# Patient Record
Sex: Male | Born: 1990 | Race: Black or African American | Hispanic: No | Marital: Single | Smoking: Never smoker
Health system: Southern US, Community
[De-identification: ages and names within clinical notes are randomized; demographics above are authoritative.]

---
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
## 2010-06-17 ENCOUNTER — Encounter: Payer: Self-pay | Admitting: Family Medicine

## 2015-06-06 ENCOUNTER — Encounter (HOSPITAL_COMMUNITY): Payer: Self-pay | Admitting: Family Medicine

## 2015-06-06 ENCOUNTER — Inpatient Hospital Stay (HOSPITAL_COMMUNITY)
Admission: EM | Admit: 2015-06-06 | Discharge: 2015-06-09 | DRG: 358 | Disposition: A | Discharge status: Home / Self Care | Payer: 59 | Attending: Surgery | Admitting: Surgery

## 2015-06-06 ENCOUNTER — Emergency Department (HOSPITAL_COMMUNITY): Payer: 59

## 2015-06-06 DIAGNOSIS — R1031 Right lower quadrant pain: Secondary | ICD-10-CM | POA: Diagnosis present

## 2015-06-06 DIAGNOSIS — R1903 Right lower quadrant abdominal swelling, mass and lump: Principal | ICD-10-CM | POA: Diagnosis present

## 2015-06-06 DIAGNOSIS — Z91010 Allergy to peanuts: Secondary | ICD-10-CM

## 2015-06-06 DIAGNOSIS — R188 Other ascites: Secondary | ICD-10-CM | POA: Diagnosis present

## 2015-06-06 DIAGNOSIS — K5900 Constipation, unspecified: Secondary | ICD-10-CM | POA: Diagnosis present

## 2015-06-06 LAB — CBC
HCT: 45.6 % (ref 39.0–52.0)
HEMOGLOBIN: 14.7 g/dL (ref 13.0–17.0)
MCH: 28.2 pg (ref 26.0–34.0)
MCHC: 32.2 g/dL (ref 30.0–36.0)
MCV: 87.5 fL (ref 78.0–100.0)
PLATELETS: 346 10*3/uL (ref 150–400)
RBC: 5.21 MIL/uL (ref 4.22–5.81)
RDW: 12.9 % (ref 11.5–15.5)
WBC: 9.2 10*3/uL (ref 4.0–10.5)

## 2015-06-06 LAB — URINALYSIS, ROUTINE W REFLEX MICROSCOPIC
BILIRUBIN URINE: NEGATIVE
GLUCOSE, UA: NEGATIVE mg/dL
HGB URINE DIPSTICK: NEGATIVE
KETONES UR: NEGATIVE mg/dL
Nitrite: NEGATIVE
PROTEIN: NEGATIVE mg/dL
Specific Gravity, Urine: 1.025 (ref 1.005–1.030)
pH: 7 (ref 5.0–8.0)

## 2015-06-06 LAB — COMPREHENSIVE METABOLIC PANEL
ALK PHOS: 87 U/L (ref 38–126)
ALT: 32 U/L (ref 17–63)
ANION GAP: 10 (ref 5–15)
AST: 26 U/L (ref 15–41)
Albumin: 3.6 g/dL (ref 3.5–5.0)
BUN: 16 mg/dL (ref 6–20)
CALCIUM: 9 mg/dL (ref 8.9–10.3)
CHLORIDE: 105 mmol/L (ref 101–111)
CO2: 25 mmol/L (ref 22–32)
Creatinine, Ser: 1.31 mg/dL — ABNORMAL HIGH (ref 0.61–1.24)
GFR calc non Af Amer: >60 mL/min (ref >60)
Glucose, Bld: 98 mg/dL (ref 65–99)
Potassium: 4.3 mmol/L (ref 3.5–5.1)
SODIUM: 140 mmol/L (ref 135–145)
Total Bilirubin: 0.8 mg/dL (ref 0.3–1.2)
Total Protein: 7.6 g/dL (ref 6.5–8.1)

## 2015-06-06 LAB — URINE MICROSCOPIC-ADD ON
RBC / HPF: NONE SEEN RBC/hpf (ref 0–5)
SQUAMOUS EPITHELIAL / LPF: NONE SEEN

## 2015-06-06 LAB — LIPASE, BLOOD: LIPASE: 34 U/L (ref 11–51)

## 2015-06-06 MED ORDER — KCL IN DEXTROSE-NACL 20-5-0.9 MEQ/L-%-% IV SOLN
INTRAVENOUS | Status: DC
  Administered 2015-06-06 – 2015-06-09 (×6): via INTRAVENOUS
  Filled 2015-06-06 (×9): qty 1000

## 2015-06-06 MED ORDER — SODIUM CHLORIDE 0.9 % IV BOLUS (SEPSIS)
1000.0000 mL | Freq: Once | INTRAVENOUS | Status: AC
  Administered 2015-06-06: 1000 mL via INTRAVENOUS

## 2015-06-06 MED ORDER — ONDANSETRON 4 MG PO TBDP: 4.0000 mg | ORAL_TABLET | Freq: Four times a day (QID) | ORAL | Status: DC | PRN

## 2015-06-06 MED ORDER — ENOXAPARIN SODIUM 40 MG/0.4ML ~~LOC~~ SOLN: 40.0000 mg | SUBCUTANEOUS | Status: DC

## 2015-06-06 MED ORDER — ONDANSETRON HCL 4 MG/2ML IJ SOLN
4.0000 mg | Freq: Four times a day (QID) | INTRAMUSCULAR | Status: DC | PRN
  Administered 2015-06-07 – 2015-06-08 (×2): 4 mg via INTRAVENOUS
  Filled 2015-06-06 (×2): qty 2

## 2015-06-06 MED ORDER — HYDROMORPHONE HCL 1 MG/ML IJ SOLN
1.0000 mg | INTRAMUSCULAR | Status: DC | PRN
  Administered 2015-06-07 – 2015-06-09 (×5): 1 mg via INTRAVENOUS
  Filled 2015-06-06 (×5): qty 1

## 2015-06-06 MED ORDER — IOHEXOL 300 MG/ML  SOLN
100.0000 mL | Freq: Once | INTRAMUSCULAR | Status: AC | PRN
  Administered 2015-06-06: 100 mL via INTRAVENOUS

## 2015-06-06 NOTE — ED Notes (Signed)
Pt given sandwich and water.

## 2015-06-06 NOTE — ED Notes (Signed)
[facility] that is present upon palpation. sts noticed Monday. sts some nausea. sts thought he was constipated and tried a cleanse but no relief.
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
## 2015-06-06 NOTE — Progress Notes (Signed)
  Pt admitted to the unit. Pt is stable, alert and oriented per baseline. Oriented to room, staff, and call bell. Educated to call for any assistance. Bed in lowest position, call bell within reach- will continue to monitor. 

## 2015-06-06 NOTE — ED Provider Notes (Signed)
CSN: [id number]     Arrival date & time 06/06/15  1441 History   First MD Initiated Contact with Patient 06/06/15 1910     Chief Complaint  Patient presents with  . Abdominal Pain     (Consider location/radiation/quality/duration/timing/severity/associated sxs/prior Treatment) HPI  25 year old male presents with right lower quadrant abdominal pain. Started having lower abdominal pain for the past several days. Diet was constipation related, tried a "cleanse" that resulted in multiple bowel movements but no improvement in the pain. In fact the pain is worsened and is now mostly right lower abdomen. Denies fevers, vomiting, or decreased oral intake. Pain is coming and going, usually worse when getting up and walking. Felt nauseated last night but no nausea currently. Rates his pain as a 4/10 feels like he has strained a muscle. No urinary symptoms. Patient states his lower abdomen feels firm.  History reviewed. No pertinent past medical history. History reviewed. No pertinent past surgical history. History reviewed. No pertinent family history. Social History  Substance Use Topics  . Smoking status: Never Smoker   . Smokeless tobacco: None  . Alcohol Use: Yes    Review of Systems  Constitutional: Negative for fever.  Gastrointestinal: Positive for nausea, abdominal pain and constipation. Negative for vomiting.  Genitourinary: Negative for dysuria.  All other systems reviewed and are negative.     Allergies  Review of patient's allergies indicates no known allergies.  Home Medications   Prior to Admission medications   Not on File   BP 143/63 mmHg  Pulse 97  Temp(Src) 98.5 F (36.9 C) (Oral)  Resp 18  SpO2 100% Physical Exam  Constitutional: He is oriented to person, place, and time. He appears well-developed and well-nourished.  HENT:  Head: Normocephalic and atraumatic.  Right Ear: External ear normal.  Left Ear: External ear normal.  Nose: Nose normal.  Eyes: Right  eye exhibits no discharge. Left eye exhibits no discharge.  Neck: Neck supple.  Cardiovascular: Normal rate, regular rhythm, normal heart sounds and intact distal pulses.   Pulmonary/Chest: Effort normal and breath sounds normal.  Abdominal: Soft. There is tenderness in the right lower quadrant. There is no rigidity.  Musculoskeletal: He exhibits no edema.  Neurological: He is alert and oriented to person, place, and time.  Skin: Skin is warm and dry.  Nursing note and vitals reviewed.   ED Course  Procedures (including critical care time) Labs Review Labs Reviewed  COMPREHENSIVE METABOLIC PANEL - Abnormal; Notable for the following:    Creatinine, Ser 1.31 (*)    All other components within normal limits  URINALYSIS, ROUTINE W REFLEX MICROSCOPIC (NOT [facility]) - Abnormal; Notable for the following:    Leukocytes, UA SMALL (*)    All other components within normal limits  URINE MICROSCOPIC-ADD ON - Abnormal; Notable for the following:    Bacteria, UA RARE (*)    All other components within normal limits  LIPASE, BLOOD  CBC    Imaging Review Ct Abdomen Pelvis W Contrast  06/06/2015  CLINICAL DATA:  Right-sided abdominal pain for several days. Nausea and constipation. EXAM: CT ABDOMEN AND PELVIS WITH CONTRAST TECHNIQUE: Multidetector CT imaging of the abdomen and pelvis was performed using the standard protocol following bolus administration of intravenous contrast. CONTRAST:  100mL OMNIPAQUE IOHEXOL 300 MG/ML  SOLN COMPARISON:  None. FINDINGS: Lower chest:  Lung bases are clear. Hepatobiliary: No focal liver lesions are identified. Gallbladder wall is not appreciably thickened. There is no biliary duct dilatation. Pancreas: No pancreatic  mass or inflammatory focus. Spleen: No splenic lesions are identified. Adrenals/Urinary Tract: Adrenals appear normal bilaterally. There is no renal mass or hydronephrosis on either side. There is no renal or ureteral calculus on either side. Urinary  bladder is midline with normal wall thickness. Stomach/Bowel: There is no appreciable bowel wall or mesenteric thickening. No bowel obstruction. No free air or portal venous air. Vascular/Lymphatic: There is no abdominal aortic aneurysm. There is no major mesenteric vessels obstruction or narrowing. There is no apparent adenopathy in the abdomen or pelvis. Reproductive: Prostate and seminal vesicles appear normal. Other: There is a complex mixed attenuation mass arising in the right abdomen measuring 11.8 x 10.8 x 8.7 cm. There is a cystic component to this mass along the superior aspect. There is calcification along the periphery of this mass. This mass shows mild enhancement in the non cystic portions. This mass appears separate from of the proximal ascending colon but impresses upon this area. This mass impresses upon and the expected location of the appendix; the appendix is not seen separate from this mass. There is a small amount of ascites near this mass in the right lower quadrant. A lesser degree of ascites is noted in the upper pelvis toward the left. Musculoskeletal: There are no blastic or lytic bone lesions. No intramuscular abdominal wall lesion. IMPRESSION: Large right-sided abdominal mass with mixed cystic and solid components. This complex mass shows peripheral calcification in its wall and mild enhancement in the noncystic portions. This lesion measures 11.8 x 10.8 x 8.7 cm. The appendix is not seen as a separate structure. It is possible that this lesion arises from the appendix. There is nearby ascites in the right lower quadrant region. A small amount of loculated ascites is noted in the upper left pelvis. The etiology of this large mass is uncertain. 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 This mass potentially also may represent a sarcoma or desmoid type tumor. It conceivably could represent a long-standing phlegmon type lesion. There is no  air in this lesion to confirm abscess. The nearby ascites does suggests that this lesion may have inflammatory etiology or at least an inflammatory component. No demonstrable liver lesion or adenopathy. No renal or ureteral calculus.  No hydronephrosis. These results were called by telephone at the time of interpretation on 06/06/2015 at 9:09 pm to Dr. Pricilla Loveless , who verbally acknowledged these results. Electronically Signed   By: Bretta Bang III M.D.   On: 06/06/2015 21:09   I have personally reviewed and evaluated these images and lab results as part of my medical decision-making.   EKG Interpretation None      MDM   Final diagnoses:  Abdominal mass, RLQ (right lower quadrant)    Patient's CT shows a large right lower quadrant mass. Large differential which includes abscess, mucocele, or tumor. Discussed with Dr. Luisa Hart of general surgery who will admit the patient for likely surgery in the morning. Patient advised of results and plan.    Pricilla Loveless, MD 06/07/15 [phone]

## 2015-06-06 NOTE — ED Notes (Addendum)
MD at bedside. Surgery.

## 2015-06-06 NOTE — ED Notes (Signed)
Attempted to call report

## 2015-06-07 ENCOUNTER — Inpatient Hospital Stay (HOSPITAL_COMMUNITY): Payer: 59

## 2015-06-07 DIAGNOSIS — R1903 Right lower quadrant abdominal swelling, mass and lump: Secondary | ICD-10-CM | POA: Insufficient documentation

## 2015-06-07 LAB — SURGICAL PCR SCREEN
MRSA, PCR: NEGATIVE
Staphylococcus aureus: NEGATIVE

## 2015-06-07 LAB — APTT: APTT: 33 s (ref 24–37)

## 2015-06-07 LAB — PROTIME-INR
INR: 1.12 (ref 0.00–1.49)
Prothrombin Time: 14.6 seconds (ref 11.6–15.2)

## 2015-06-07 MED ORDER — ENOXAPARIN SODIUM 40 MG/0.4ML ~~LOC~~ SOLN
40.0000 mg | SUBCUTANEOUS | Status: DC
  Administered 2015-06-08: 40 mg via SUBCUTANEOUS
  Filled 2015-06-07: qty 0.4

## 2015-06-07 MED ORDER — MIDAZOLAM HCL 2 MG/2ML IJ SOLN
INTRAMUSCULAR | Status: AC
Start: 2015-06-07 — End: 2015-06-08
  Filled 2015-06-07: qty 2

## 2015-06-07 MED ORDER — GELATIN ABSORBABLE 12-7 MM EX MISC
CUTANEOUS | Status: AC
  Filled 2015-06-07: qty 1

## 2015-06-07 MED ORDER — FENTANYL CITRATE (PF) 100 MCG/2ML IJ SOLN
INTRAMUSCULAR | Status: AC
  Filled 2015-06-07: qty 2

## 2015-06-07 MED ORDER — FENTANYL CITRATE (PF) 100 MCG/2ML IJ SOLN
INTRAMUSCULAR | Status: AC | PRN
  Administered 2015-06-07 (×2): 50 ug via INTRAVENOUS

## 2015-06-07 MED ORDER — MIDAZOLAM HCL 2 MG/2ML IJ SOLN
INTRAMUSCULAR | Status: AC | PRN
  Administered 2015-06-07 (×2): 1 mg via INTRAVENOUS

## 2015-06-07 MED ORDER — LIDOCAINE-EPINEPHRINE 1 %-1:100000 IJ SOLN
INTRAMUSCULAR | Status: AC
  Filled 2015-06-07: qty 1

## 2015-06-07 NOTE — Sedation Documentation (Signed)
Patient is resting comfortably. 

## 2015-06-07 NOTE — Procedures (Signed)
Technically successful CT guided biopsy of indeterminate large complex mass within the right mid abdomen.  No immediate post procedural complications.   Katherina RightJay Corri Delapaz, MD Pager #: [phone]

## 2015-06-07 NOTE — Care Management Note (Signed)
Case Management Note  Patient Details  Name: Juan Sherman MRN: [id number] Date of Birth: 1990/07/03  Subjective/Objective:                    Action/Plan:   Expected Discharge Date:  06/09/15               Expected Discharge Plan:  Home/Self Care  In-House Referral:     Discharge planning Services     Post Acute Care Choice:    Choice offered to:     DME Arranged:    DME Agency:     HH Arranged:    HH Agency:     Status of Service:  In process, will continue to follow  Medicare Important Message Given:    Date Medicare IM Given:    Medicare IM give by:    Date Additional Medicare IM Given:    Additional Medicare Important Message give by:     If discussed at Long Length of Stay Meetings, dates discussed:    Additional Comments:  Kingsley PlanWile, [name], RN 06/07/2015, 11:04 AM

## 2015-06-07 NOTE — Progress Notes (Signed)
Patient ID: Margie Ege, male   DOB: [birth year], 25 y.o.   MRN: [id number]    Subjective: Pt feels ok this morning.  Pain about a 3.  Objective: Vital signs in last 24 hours: Temp:  [97.9 F (36.6 C)-98.6 F (37 C)] 98.1 F (36.7 C) (01/10 0358) Pulse Rate:  [71-97] 71 (01/10 0358) Resp:  [18-19] 18 (01/10 0358) BP: (107-143)/(53-76) 119/61 mmHg (01/10 0358) SpO2:  [98 %-100 %] 99 % (01/10 0358) Weight:  [74 kg (163 lb 2.3 oz)] 74 kg (163 lb 2.3 oz) (01/09 2230) Last BM Date: 06/05/15  Intake/Output from previous day: 01/09 0701 - 01/10 0700 In: -  Out: 1 [Urine:1] Intake/Output this shift:    PE: Abd: soft, mass palpable in RLQ, hypoactive BS, mild RLQ tenderness Heart: regular Lungs: CTAB  Lab Results:   Recent Labs  06/06/15 1508  WBC 9.2  HGB 14.7  HCT 45.6  PLT 346   BMET  Recent Labs  06/06/15 1508  NA 140  K 4.3  CL 105  CO2 25  GLUCOSE 98  BUN 16  CREATININE 1.31*  CALCIUM 9.0   PT/INR No results for input(s): LABPROT, INR in the last 72 hours. CMP     Component Value Date/Time   NA 140 06/06/2015 1508   K 4.3 06/06/2015 1508   CL 105 06/06/2015 1508   CO2 25 06/06/2015 1508   GLUCOSE 98 06/06/2015 1508   BUN 16 06/06/2015 1508   CREATININE 1.31* 06/06/2015 1508   CALCIUM 9.0 06/06/2015 1508   PROT 7.6 06/06/2015 1508   ALBUMIN 3.6 06/06/2015 1508   AST 26 06/06/2015 1508   ALT 32 06/06/2015 1508   ALKPHOS 87 06/06/2015 1508   BILITOT 0.8 06/06/2015 1508   GFRNONAA >60 06/06/2015 1508   GFRAA >60 06/06/2015 1508   Lipase     Component Value Date/Time   LIPASE 34 06/06/2015 1508       Studies/Results: Ct Abdomen Pelvis W Contrast  06/06/2015  CLINICAL DATA:  Right-sided abdominal pain for several days. Nausea and constipation. EXAM: CT ABDOMEN AND PELVIS WITH CONTRAST TECHNIQUE: Multidetector CT imaging of the abdomen and pelvis was performed using the standard protocol following bolus administration of intravenous contrast.  CONTRAST:  OMNIPAQUE IOHEXOL 300 MG/ML  SOLN COMPARISON:  None. FINDINGS: Lower chest:  Lung bases are clear. Hepatobiliary: No focal liver lesions are identified. Gallbladder wall is not appreciably thickened. There is no biliary duct dilatation. Pancreas: No pancreatic mass or inflammatory focus. Spleen: No splenic lesions are identified. Adrenals/Urinary Tract: Adrenals appear normal bilaterally. There is no renal mass or hydronephrosis on either side. There is no renal or ureteral calculus on either side. Urinary bladder is midline with normal wall thickness. Stomach/Bowel: There is no appreciable bowel wall or mesenteric thickening. No bowel obstruction. No free air or portal venous air. Vascular/Lymphatic: There is no abdominal aortic aneurysm. There is no major mesenteric vessels obstruction or narrowing. There is no apparent adenopathy in the abdomen or pelvis. Reproductive: Prostate and seminal vesicles appear normal. Other: There is a complex mixed attenuation mass arising in the right abdomen measuring 11.8 x 10.8 x 8.7 cm. There is a cystic component to this mass along the superior aspect. There is calcification along the periphery of this mass. This mass shows mild enhancement in the non cystic portions. This mass appears separate from of the proximal ascending colon but impresses upon this area. This mass impresses upon and the expected location of the appendix; the  appendix is not seen separate from this mass. There is a small amount of ascites near this mass in the right lower quadrant. A lesser degree of ascites is noted in the upper pelvis toward the left. Musculoskeletal: There are no blastic or lytic bone lesions. No intramuscular abdominal wall lesion. IMPRESSION: Large right-sided abdominal mass with mixed cystic and solid components. This complex mass shows peripheral calcification in its wall and mild enhancement in the noncystic portions. This lesion measures 11.8 x 10.8 x 8.7 cm. The  appendix is not seen as a separate structure. It is possible that this lesion arises from the appendix. There is nearby ascites in the right lower quadrant region. A small amount of loculated ascites is noted in the upper left pelvis. The etiology of this large mass is uncertain. 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 This mass potentially also may represent a sarcoma or desmoid type tumor. It conceivably could represent a long-standing phlegmon type lesion. There is no air in this lesion to confirm abscess. The nearby ascites does suggests that this lesion may have inflammatory etiology or at least an inflammatory component. No demonstrable liver lesion or adenopathy. No renal or ureteral calculus.  No hydronephrosis. These results were called by telephone at the time of interpretation on 06/06/2015 at 9:09 pm to Dr. Pricilla LovelessSCOTT GOLDSTON , who verbally acknowledged these results. Electronically Signed   By: Bretta BangWilliam  Woodruff III M.D.   On: 06/06/2015 21:09    Anti-infectives: Anti-infectives    None       Assessment/Plan  1. RLQ abdominal mass, etiology unknown -Dr. Derrell Lollingamirez has reviewed his CT scan.  Given the multiple differentials and the types of surgery each may need, we will plan for a core needle biopsy today for pathology.  Once this pathology is determined, then we can decide what type of an operation the patient will need for resection. -NPO -lovenox on hold -PR/INR pending 2. DVT proph Scds/lovenox(on hold today for procedure)   LOS: 1 day    Jeraldine Primeau E 06/07/2015, 7:33 AM Pager: [phone]

## 2015-06-07 NOTE — Sedation Documentation (Signed)
Vital signs stable. 

## 2015-06-07 NOTE — Consult Note (Signed)
Chief Complaint: Patient was seen in consultation today for abdominal mass biopsy Chief Complaint  Patient presents with  . Abdominal Pain   at the request of Dr Derrell Lolling  Referring Physician(s): Dr Derrell Lolling  History of Present Illness: Juan Sherman is a 25 y.o. male   Pt with onset abd pain and constipation 3 weeks ago Attempted "cleansing" which did help but then noticed "lump" in Right abdomen CT:  IMPRESSION: Large right-sided abdominal mass with mixed cystic and solid components. This complex mass shows peripheral calcification in its wall and mild enhancement in the noncystic portions. This lesion measures 11.8 x 10.8 x 8.7 cm. The appendix is not seen as a separate structure. It is possible that this lesion arises from the appendix. There is nearby ascites in the right lower quadrant region. A small amount of loculated ascites is noted in the upper left pelvis.  The etiology of this large mass is uncertain. 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 This mass potentially also may represent a sarcoma or desmoid type tumor. It conceivably could represent a long-standing phlegmon type lesion. There is no air in this lesion to confirm abscess. The nearby ascites does suggests that this lesion may have inflammatory etiology or at least an inflammatory component.  Request for core biopsy of RLQ abdominal mass Dr Grace Isaac has reviewed imaging and approves procedure I have seen and examined pt Scheduled now for RLQ abd mass biopsy   History reviewed. No pertinent past medical history.  History reviewed. No pertinent past surgical history.  Allergies: Peanuts  Medications: Prior to Admission medications   Medication Sig Start Date End Date Taking? Authorizing Provider  naproxen (NAPROSYN) 500 MG tablet Take 500 mg by mouth 2 (two) times daily as needed for moderate pain.   Yes Historical Provider, MD     History  reviewed. No pertinent family history.  Social History   Social History  . Marital Status: Single    Spouse Name: N/A  . Number of Children: N/A  . Years of Education: N/A   Social History Main Topics  . Smoking status: Never Smoker   . Smokeless tobacco: None  . Alcohol Use: Yes  . Drug Use: None  . Sexual Activity: Not Asked   Other Topics Concern  . None   Social History Narrative  . None     Review of Systems: A 12 point ROS discussed and pertinent positives are indicated in the HPI above.  All other systems are negative.  Review of Systems  Constitutional: Positive for activity change. Negative for fever and appetite change.  Respiratory: Negative for shortness of breath.   Gastrointestinal: Positive for abdominal pain and constipation.  Psychiatric/Behavioral: Negative for behavioral problems and confusion.    Vital Signs: BP 119/61 mmHg  Pulse 71  Temp(Src) 98.1 F (36.7 C) (Oral)  Resp 18  Ht 5\' 3"  (1.6 m)  Wt 163 lb 2.3 oz (74 kg)  BMI 28.91 kg/m2  SpO2 99%  Physical Exam  Constitutional: He is oriented to person, place, and time. He appears well-nourished.  Cardiovascular: Normal rate, regular rhythm and normal heart sounds.   Pulmonary/Chest: Effort normal and breath sounds normal. He has no wheezes.  Abdominal: Soft. Bowel sounds are normal. There is tenderness.  Musculoskeletal: Normal range of motion.  Neurological: He is alert and oriented to person, place, and time.  Skin: Skin is warm and dry.  Psychiatric: He has a normal mood and affect. His behavior  is normal. Judgment and thought content normal.  Nursing note and vitals reviewed.   Mallampati Score:  MD Evaluation Airway: WNL Heart: WNL Abdomen: WNL Chest/ Lungs: WNL ASA  Classification: 2 Mallampati/Airway Score: One  Imaging: Ct Abdomen Pelvis W Contrast  06/06/2015  CLINICAL DATA:  Right-sided abdominal pain for several days. Nausea and constipation. EXAM: CT ABDOMEN AND  PELVIS WITH CONTRAST TECHNIQUE: Multidetector CT imaging of the abdomen and pelvis was performed using the standard protocol following bolus administration of intravenous contrast. CONTRAST:  OMNIPAQUE IOHEXOL 300 MG/ML  SOLN COMPARISON:  None. FINDINGS: Lower chest:  Lung bases are clear. Hepatobiliary: No focal liver lesions are identified. Gallbladder wall is not appreciably thickened. There is no biliary duct dilatation. Pancreas: No pancreatic mass or inflammatory focus. Spleen: No splenic lesions are identified. Adrenals/Urinary Tract: Adrenals appear normal bilaterally. There is no renal mass or hydronephrosis on either side. There is no renal or ureteral calculus on either side. Urinary bladder is midline with normal wall thickness. Stomach/Bowel: There is no appreciable bowel wall or mesenteric thickening. No bowel obstruction. No free air or portal venous air. Vascular/Lymphatic: There is no abdominal aortic aneurysm. There is no major mesenteric vessels obstruction or narrowing. There is no apparent adenopathy in the abdomen or pelvis. Reproductive: Prostate and seminal vesicles appear normal. Other: There is a complex mixed attenuation mass arising in the right abdomen measuring 11.8 x 10.8 x 8.7 cm. There is a cystic component to this mass along the superior aspect. There is calcification along the periphery of this mass. This mass shows mild enhancement in the non cystic portions. This mass appears separate from of the proximal ascending colon but impresses upon this area. This mass impresses upon and the expected location of the appendix; the appendix is not seen separate from this mass. There is a small amount of ascites near this mass in the right lower quadrant. A lesser degree of ascites is noted in the upper pelvis toward the left. Musculoskeletal: There are no blastic or lytic bone lesions. No intramuscular abdominal wall lesion. IMPRESSION: Large right-sided abdominal mass with mixed  cystic and solid components. This complex mass shows peripheral calcification in its wall and mild enhancement in the noncystic portions. This lesion measures 11.8 x 10.8 x 8.7 cm. The appendix is not seen as a separate structure. It is possible that this lesion arises from the appendix. There is nearby ascites in the right lower quadrant region. A small amount of loculated ascites is noted in the upper left pelvis. The etiology of this large mass is uncertain. 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 This mass potentially also may represent a sarcoma or desmoid type tumor. It conceivably could represent a long-standing phlegmon type lesion. There is no air in this lesion to confirm abscess. The nearby ascites does suggests that this lesion may have inflammatory etiology or at least an inflammatory component. No demonstrable liver lesion or adenopathy. No renal or ureteral calculus.  No hydronephrosis. These results were called by telephone at the time of interpretation on 06/06/2015 at 9:09 pm to Dr. Pricilla Loveless , who verbally acknowledged these results. Electronically Signed   By: Bretta Bang III M.D.   On: 06/06/2015 21:09    Labs:  CBC:  Recent Labs  06/06/15 1508  WBC 9.2  HGB 14.7  HCT 45.6  PLT 346    COAGS: No results for input(s): INR, APTT in the last 8760 hours.  BMP:  Recent  Labs  06/06/15 1508  NA 140  K 4.3  CL 105  CO2 25  GLUCOSE 98  BUN 16  CALCIUM 9.0  CREATININE 1.31*  GFRNONAA >60  GFRAA >60    LIVER FUNCTION TESTS:  Recent Labs  06/06/15 1508  BILITOT 0.8  AST 26  ALT 32  ALKPHOS 87  PROT 7.6  ALBUMIN 3.6    TUMOR MARKERS: No results for input(s): AFPTM, CEA, CA199, CHROMGRNA in the last 8760 hours.  Assessment and Plan:  Abd pain x 3 weeks CT reveals RLQ abd mass Now scheduled for biopsy of same Risks and Benefits discussed with the patient including, but not limited to bleeding,  infection, damage to adjacent structures or low yield requiring additional tests. All of the patient's questions were answered, patient is agreeable to proceed. Consent signed and in chart.   Thank you for this interesting consult.  I greatly enjoyed meeting [address] and look forward to participating in their care.  A copy of this report was sent to the requesting provider on this date.  Signed: Alysiana Ethridge A 06/07/2015, 8:31 AM   I spent a total of 40 Minutes    in face to face in clinical consultation, greater than 50% of which was counseling/coordinating care for RLQ abd mass biopsy

## 2015-06-07 NOTE — Sedation Documentation (Signed)
Vital signs stable. Pt resting, MD at bedside.

## 2015-06-08 NOTE — Progress Notes (Signed)
Patient ID: Juan Sherman, male   DOB: [birth year], 25 y.o.   MRN: [id number]    Subjective: Pt feels well today, still with some pain.  Objective: Vital signs in last 24 hours: Temp:  [97.9 F (36.6 C)-98.6 F (37 C)] 97.9 F (36.6 C) (01/11 0614) Pulse Rate:  [63-78] 73 (01/11 0614) Resp:  [11-20] 18 (01/11 0614) BP: (103-122)/(33-59) 121/58 mmHg (01/11 0614) SpO2:  [94 %-100 %] 99 % (01/11 0614) Last BM Date: 06/05/15  Intake/Output from previous day: 01/10 0701 - 01/11 0700 In: 600 [I.V.:600] Out: 600 [Urine:600] Intake/Output this shift:    PE: Abd: soft, tender on right side, greatest in RLQ, mild distention on right  Lab Results:   Recent Labs  06/06/15 1508  WBC 9.2  HGB 14.7  HCT 45.6  PLT 346   BMET  Recent Labs  06/06/15 1508  NA 140  K 4.3  CL 105  CO2 25  GLUCOSE 98  BUN 16  CREATININE 1.31*  CALCIUM 9.0   PT/INR  Recent Labs  06/07/15 0800  LABPROT 14.6  INR 1.12   CMP     Component Value Date/Time   NA 140 06/06/2015 1508   K 4.3 06/06/2015 1508   CL 105 06/06/2015 1508   CO2 25 06/06/2015 1508   GLUCOSE 98 06/06/2015 1508   BUN 16 06/06/2015 1508   CREATININE 1.31* 06/06/2015 1508   CALCIUM 9.0 06/06/2015 1508   PROT 7.6 06/06/2015 1508   ALBUMIN 3.6 06/06/2015 1508   AST 26 06/06/2015 1508   ALT 32 06/06/2015 1508   ALKPHOS 87 06/06/2015 1508   BILITOT 0.8 06/06/2015 1508   GFRNONAA >60 06/06/2015 1508   GFRAA >60 06/06/2015 1508   Lipase     Component Value Date/Time   LIPASE 34 06/06/2015 1508       Studies/Results: Ct Abdomen Pelvis W Contrast  06/06/2015  CLINICAL DATA:  Right-sided abdominal pain for several days. Nausea and constipation. EXAM: CT ABDOMEN AND PELVIS WITH CONTRAST TECHNIQUE: Multidetector CT imaging of the abdomen and pelvis was performed using the standard protocol following bolus administration of intravenous contrast. CONTRAST:  100mL OMNIPAQUE IOHEXOL 300 MG/ML  SOLN COMPARISON:  None.  FINDINGS: Lower chest:  Lung bases are clear. Hepatobiliary: No focal liver lesions are identified. Gallbladder wall is not appreciably thickened. There is no biliary duct dilatation. Pancreas: No pancreatic mass or inflammatory focus. Spleen: No splenic lesions are identified. Adrenals/Urinary Tract: Adrenals appear normal bilaterally. There is no renal mass or hydronephrosis on either side. There is no renal or ureteral calculus on either side. Urinary bladder is midline with normal wall thickness. Stomach/Bowel: There is no appreciable bowel wall or mesenteric thickening. No bowel obstruction. No free air or portal venous air. Vascular/Lymphatic: There is no abdominal aortic aneurysm. There is no major mesenteric vessels obstruction or narrowing. There is no apparent adenopathy in the abdomen or pelvis. Reproductive: Prostate and seminal vesicles appear normal. Other: There is a complex mixed attenuation mass arising in the right abdomen measuring 11.8 x 10.8 x 8.7 cm. There is a cystic component to this mass along the superior aspect. There is calcification along the periphery of this mass. This mass shows mild enhancement in the non cystic portions. This mass appears separate from of the proximal ascending colon but impresses upon this area. This mass impresses upon and the expected location of the appendix; the appendix is not seen separate from this mass. There is a small amount of ascites near this  mass in the right lower quadrant. A lesser degree of ascites is noted in the upper pelvis toward the left. Musculoskeletal: There are no blastic or lytic bone lesions. No intramuscular abdominal wall lesion. IMPRESSION: Large right-sided abdominal mass with mixed cystic and solid components. This complex mass shows peripheral calcification in its wall and mild enhancement in the noncystic portions. This lesion measures 11.8 x 10.8 x 8.7 cm. The appendix is not seen as a separate structure. It is possible that this  lesion arises from the appendix. There is nearby ascites in the right lower quadrant region. A small amount of loculated ascites is noted in the upper left pelvis. The etiology of this large mass is uncertain. 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 This mass potentially also may represent a sarcoma or desmoid type tumor. It conceivably could represent a long-standing phlegmon type lesion. There is no air in this lesion to confirm abscess. The nearby ascites does suggests that this lesion may have inflammatory etiology or at least an inflammatory component. No demonstrable liver lesion or adenopathy. No renal or ureteral calculus.  No hydronephrosis. These results were called by telephone at the time of interpretation on 06/06/2015 at 9:09 pm to Dr. Pricilla Loveless , who verbally acknowledged these results. Electronically Signed   By: Bretta Bang III M.D.   On: 06/06/2015 21:09   Ct Biopsy  06/07/2015  INDICATION: No known primary, now with large (approximately 11.8 cm) complex mass within the right mid abdomen. Please perform CT-guided biopsy for tissue diagnostic purposes. EXAM: CT-GUIDED BIOPSY OF INDETERMINATE COMPLEX MASS WITHIN THE RIGHT MID ABDOMEN COMPARISON:  CT abdomen pelvis - 06/05/2025 MEDICATIONS: Fentanyl 100 mcg IV; Versed 2 mg IV ANESTHESIA/SEDATION: Sedation time 12 minutes CONTRAST:  None COMPLICATIONS: None immediate PROCEDURE: Informed consent was obtained from the patient following an explanation of the procedure, risks, benefits and alternatives. A time out was performed prior to the initiation of the procedure. The patient was positioned supine on the CT table and a limited CT was performed for procedural planning demonstrating unchanged appearance of dominant heterogeneous mass with the right mid abdomen measuring approximately 11.3 x 8.4 cm (image 33, series 2). The dominant more solid component within anterior medial aspect of the  mass (image 28, series 2) was targeted for biopsy The procedure was planned. The operative site was prepped and draped in the usual sterile fashion. Appropriate trajectory was confirmed with a 22 gauge spinal needle after the adjacent tissues were anesthetized with 1% Lidocaine with epinephrine. Under intermittent CT guidance, a 17 gauge coaxial needle was advanced into the peripheral aspect of the mass. Appropriate positioning was confirmed and 5 core needle biopsy samples were obtained with an 18 gauge core needle biopsy device. The coaxial needle track was embolized with a small amount of Gel-Foam slurry and superficial hemostasis was achieved with manual compression. A limited postprocedural CT was negative for hemorrhage or additional complication. A dressing was placed. The patient tolerated the procedure well without immediate postprocedural complication. IMPRESSION: Technically successful CT guided biopsy of complex mass within the right mid abdomen. Electronically Signed   By: Simonne Come M.D.   On: 06/07/2015 16:04    Anti-infectives: Anti-infectives    None       Assessment/Plan  1. RLQ abdominal mass, etiology unknown -await pathology results for operating -regular diet 2. DVT proph Scds/lovenox  LOS: 2 days    Jahmeer Porche E 06/08/2015, 8:22 AM Pager: [phone]

## 2015-06-09 LAB — BASIC METABOLIC PANEL
Anion gap: 5 (ref 5–15)
BUN: 6 mg/dL (ref 6–20)
CALCIUM: 8.2 mg/dL — AB (ref 8.9–10.3)
CO2: 28 mmol/L (ref 22–32)
Chloride: 108 mmol/L (ref 101–111)
Creatinine, Ser: 1.33 mg/dL — ABNORMAL HIGH (ref 0.61–1.24)
GFR calc Af Amer: >60 mL/min (ref >60)
GLUCOSE: 100 mg/dL — AB (ref 65–99)
POTASSIUM: 4.2 mmol/L (ref 3.5–5.1)
Sodium: 141 mmol/L (ref 135–145)

## 2015-06-09 MED ORDER — OXYCODONE HCL 5 MG PO TABS: 5.0000 mg | ORAL_TABLET | ORAL | Status: AC | PRN

## 2015-06-09 NOTE — Progress Notes (Signed)
Juan Sherman to be D/C'd  per MD order. Discussed with the patient and all questions fully answered.  VSS, Skin clean, dry and intact without evidence of skin break down, no evidence of skin tears noted.  IV catheter discontinued intact. Site without signs and symptoms of complications. Dressing and pressure applied.  An After Visit Summary was printed and given to the patient. Patient received prescription.  D/c education completed with patient/family including follow up instructions, medication list, d/c activities limitations if indicated, with other d/c instructions as indicated by MD - patient able to verbalize understanding, all questions fully answered.   Patient instructed to return to ED, call 911, or call MD for any changes in condition.   Patient to be escorted via WC, and D/C home via private auto.

## 2015-06-09 NOTE — Discharge Instructions (Signed)
You have a mass in your abdomen.  We are awaiting pathology results.  Abdominal Pain, Adult Many things can cause abdominal pain. Usually, abdominal pain is not caused by a disease and will improve without treatment. It can often be observed and treated at home. Your health care provider will do a physical exam and possibly order blood tests and X-rays to help determine the seriousness of your pain. However, in many cases, more time must pass before a clear cause of the pain can be found. Before that point, your health care provider may not know if you need more testing or further treatment. HOME CARE INSTRUCTIONS Monitor your abdominal pain for any changes. The following actions may help to alleviate any discomfort you are experiencing:  Only take over-the-counter or prescription medicines as directed by your health care provider.  Do not take laxatives unless directed to do so by your health care provider.  Try a clear liquid diet (broth, tea, or water) as directed by your health care provider. Slowly move to a bland diet as tolerated. SEEK MEDICAL CARE IF:  You have unexplained abdominal pain.  You have abdominal pain associated with nausea or diarrhea.  You have pain when you urinate or have a bowel movement.  You experience abdominal pain that wakes you in the night.  You have abdominal pain that is worsened or improved by eating food.  You have abdominal pain that is worsened with eating fatty foods.  You have a fever. SEEK IMMEDIATE MEDICAL CARE IF:  Your pain does not go away within 2 hours.  You keep throwing up (vomiting).  Your pain is felt only in portions of the abdomen, such as the right side or the left lower portion of the abdomen.  You pass bloody or black tarry stools. MAKE SURE YOU:  Understand these instructions.  Will watch your condition.  Will get help right away if you are not doing well or get worse.   This information is not intended to replace  advice given to you by your health care provider. Make sure you discuss any questions you have with your health care provider.   Document Released: 02/21/2005 Document Revised: 02/02/2015 Document Reviewed: 01/21/2013 Elsevier Interactive Patient Education [employer]2016 [employer] Inc[employer]
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
## 2017-03-03 IMAGING — CT CT BIOPSY
1 of 4 series · 12 of 32 positions shown, 18 images · non-contrast
Comparison: CT abdomen pelvis - 06/05/2025

ADDENDUM:
Moderate (conscious) sedation was employed during this procedure.
The patient's level of consciousness and vital signs were monitored
continuously by radiology nursing throughout the procedure under my
direct supervision.
INDICATION: No known primary, now with large (approximately 11.8 cm) complex
mass within the right mid abdomen. Please perform CT-guided biopsy
for tissue diagnostic purposes.

EXAM:
CT-GUIDED BIOPSY OF INDETERMINATE COMPLEX MASS WITHIN THE RIGHT MID
ABDOMEN

[Series 2: i-spiral 5.0 b40f · axial · 0.74mm/px · z∈[+992,+1150]mm · 12 of 53 slices shown, 18 images]
[im 4/53  soft-tissue]
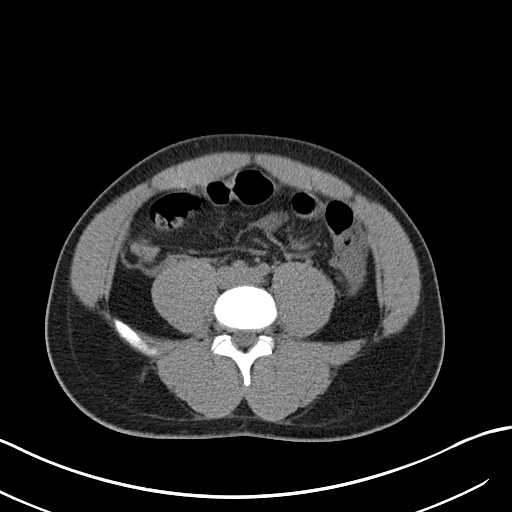
[im 4/53  bone]
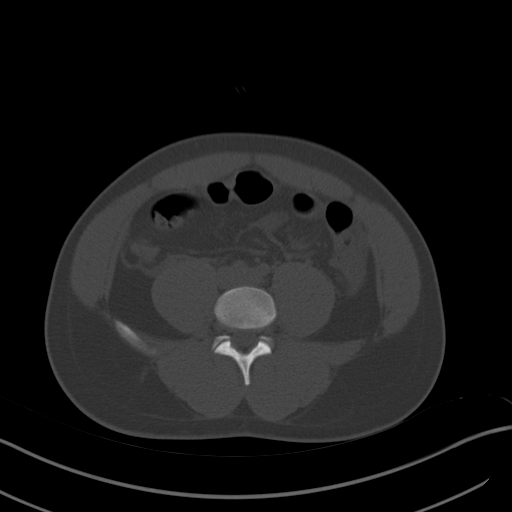
[im 8/53  soft-tissue]
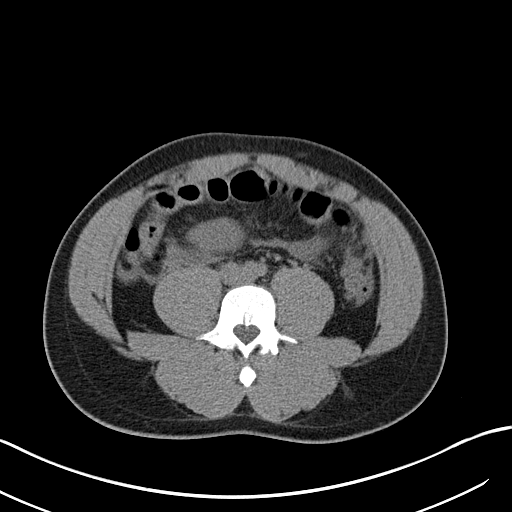
[im 12/53  soft-tissue]
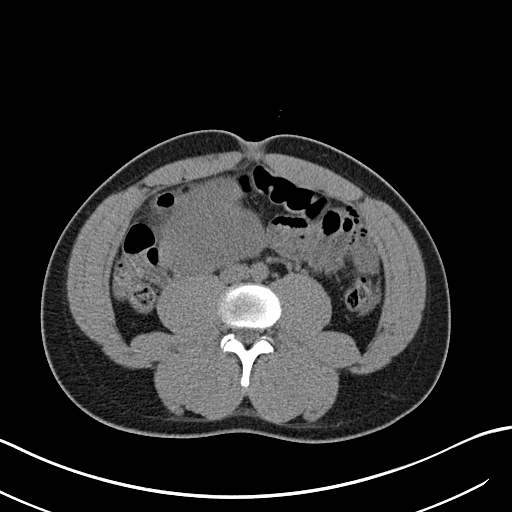
[im 15/53  soft-tissue]
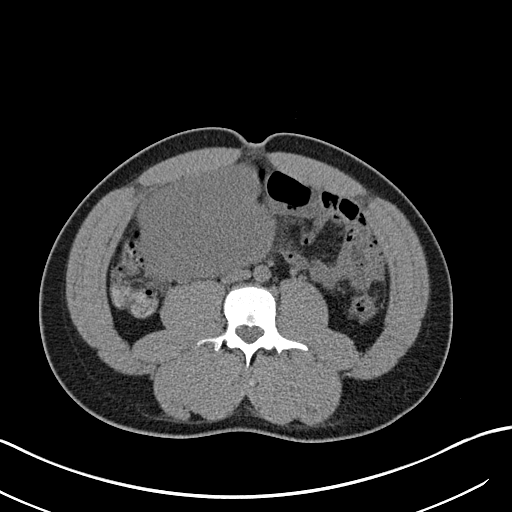
[im 19/53  soft-tissue]
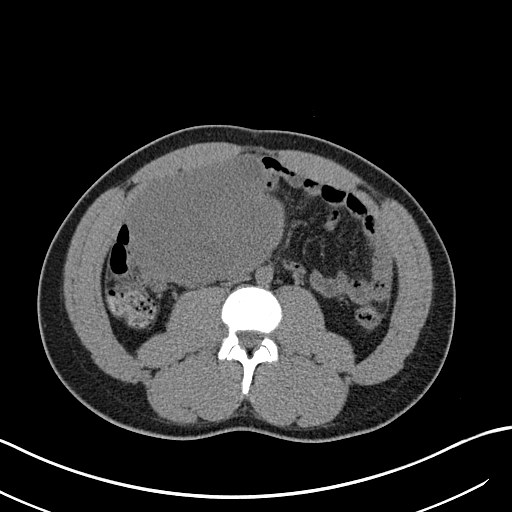
[im 23/53  soft-tissue]
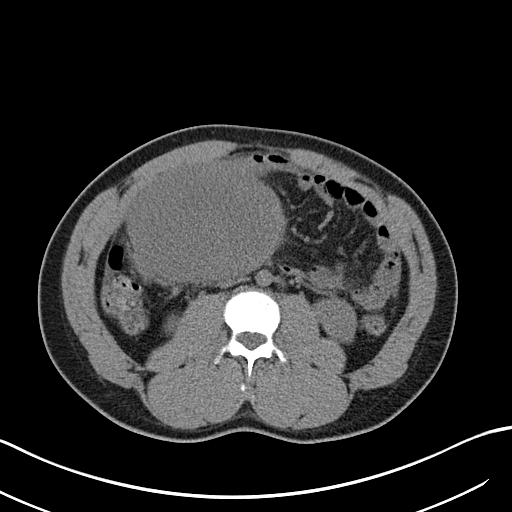
[im 30/53  soft-tissue]
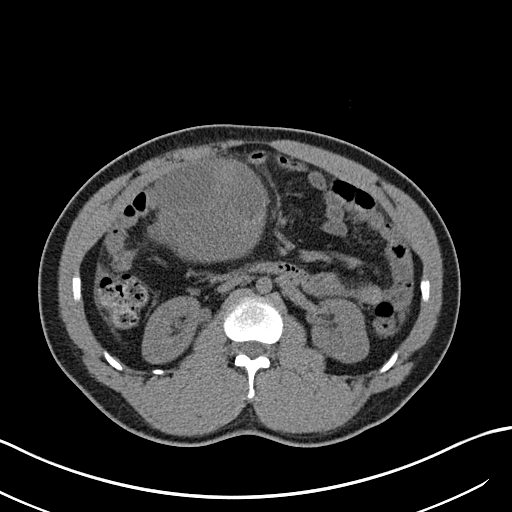
[im 34/53  soft-tissue]
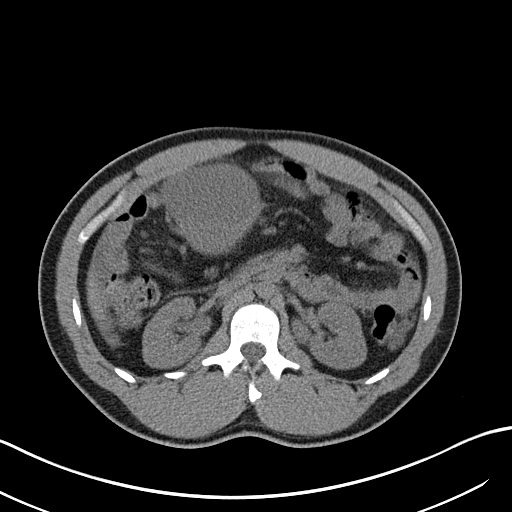
[im 38/53  soft-tissue]
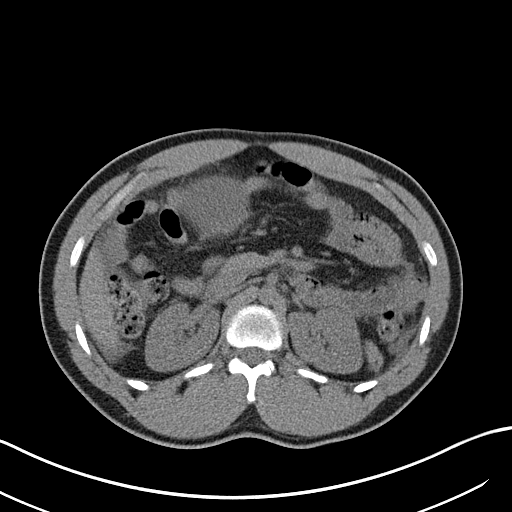
[im 38/53  lung]
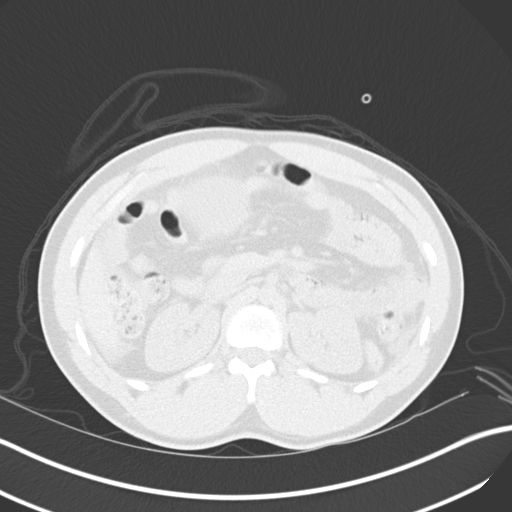
[im 38/53  bone]
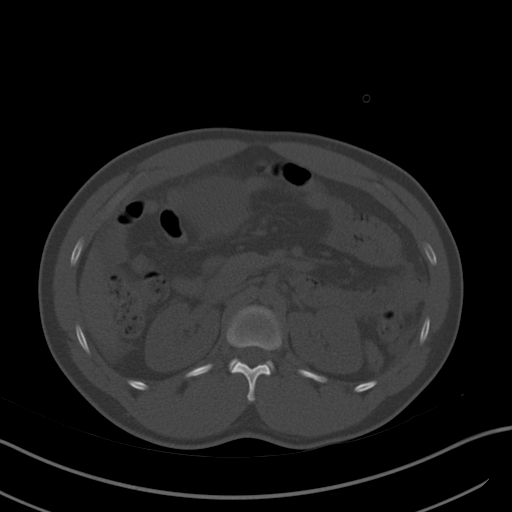
[im 41/53  soft-tissue]
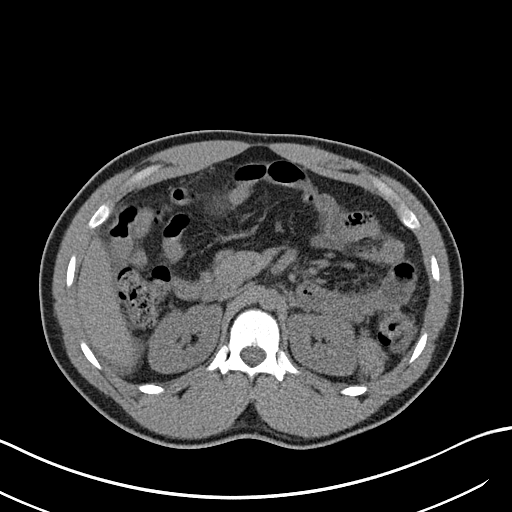
[im 41/53  lung]
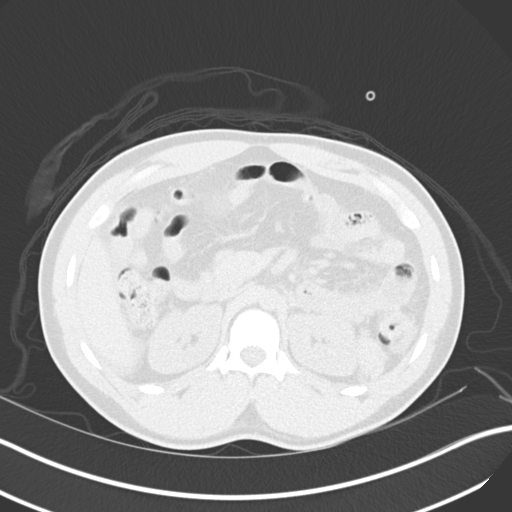
[im 45/53  soft-tissue]
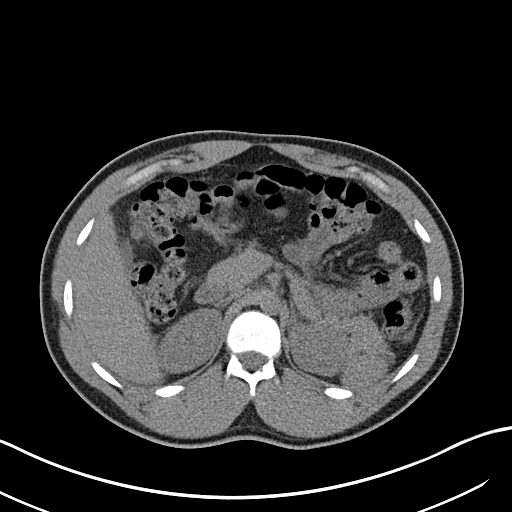
[im 45/53  lung]
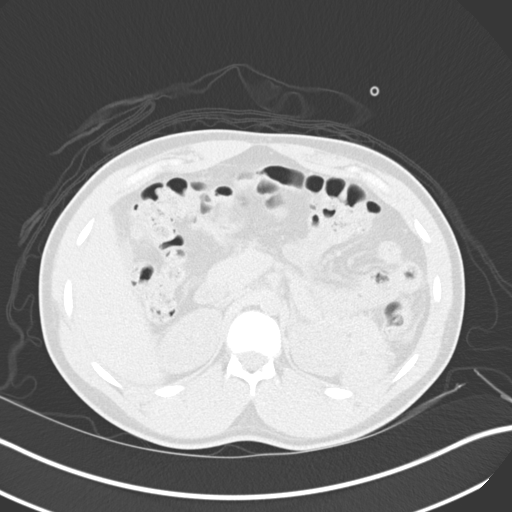
[im 49/53  soft-tissue]
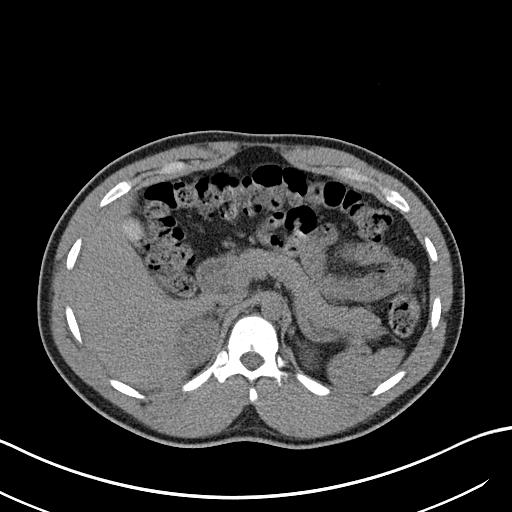
[im 49/53  lung]
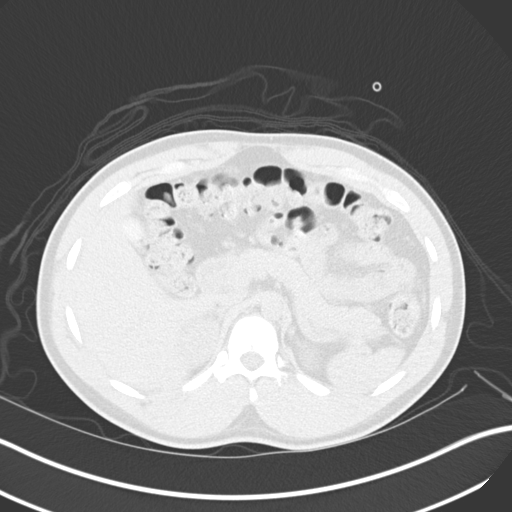

[12 of 32 positions shown; findings below may reference images not displayed]

MEDICATIONS:
Fentanyl 100 mcg IV; Versed 2 mg IV

ANESTHESIA/SEDATION:
Sedation time

12 minutes

CONTRAST:  None

COMPLICATIONS:
None immediate

PROCEDURE:
Informed consent was obtained from the patient following an
explanation of the procedure, risks, benefits and alternatives. A
time out was performed prior to the initiation of the procedure.

The patient was positioned supine on the CT table and a limited CT
was performed for procedural planning demonstrating unchanged
appearance of dominant heterogeneous mass with the right mid abdomen
measuring approximately 11.3 x 8.4 cm (image 33, series 2). The
dominant more solid component within anterior medial aspect of the
mass (image 28, series 2) was targeted for biopsy The procedure was
planned. The operative site was prepped and draped in the usual
sterile fashion. Appropriate trajectory was confirmed with a 22
gauge spinal needle after the adjacent tissues were anesthetized
with 1% Lidocaine with epinephrine.

Under intermittent CT guidance, a 17 gauge coaxial needle was
advanced into the peripheral aspect of the mass.

Appropriate positioning was confirmed and 5 core needle biopsy
samples were obtained with an 18 gauge core needle biopsy device.

The coaxial needle track was embolized with a small amount of
Gel-Foam slurry and superficial hemostasis was achieved with manual
compression. A limited postprocedural CT was negative for hemorrhage
or additional complication. A dressing was placed. The patient
tolerated the procedure well without immediate postprocedural
complication.
IMPRESSION: Technically successful CT guided biopsy of complex mass within the
right mid abdomen.

## 2023-01-10 ENCOUNTER — Ambulatory Visit: Payer: 59 | Admitting: Occupational Therapy

## 2023-01-21 ENCOUNTER — Ambulatory Visit: Payer: 59 | Admitting: Occupational Therapy
